# Patient Record
Sex: Male | Born: 1990 | Race: Black or African American | Hispanic: No | Marital: Single | Smoking: Current every day smoker
Health system: Southern US, Community
[De-identification: ages and names within clinical notes are randomized; demographics above are authoritative.]

## PROBLEM LIST (undated history)

## (undated) DIAGNOSIS — G43909 Migraine, unspecified, not intractable, without status migrainosus: Secondary | ICD-10-CM

## (undated) DIAGNOSIS — R569 Unspecified convulsions: Secondary | ICD-10-CM

## (undated) DIAGNOSIS — R011 Cardiac murmur, unspecified: Secondary | ICD-10-CM

## (undated) HISTORY — PX: OTHER SURGICAL HISTORY: SHX169

## (undated) HISTORY — DX: Migraine, unspecified, not intractable, without status migrainosus: G43.909

---
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
## 2009-08-07 ENCOUNTER — Emergency Department (HOSPITAL_COMMUNITY): Admission: EM | Admit: 2009-08-07 | Discharge: 2009-08-07 | Payer: Self-pay | Admitting: Emergency Medicine

## 2010-02-05 ENCOUNTER — Emergency Department (HOSPITAL_COMMUNITY)
Admission: EM | Admit: 2010-02-05 | Discharge: 2010-02-05 | Payer: Self-pay | Source: Home / Self Care | Admitting: Emergency Medicine

## 2010-02-27 ENCOUNTER — Emergency Department (HOSPITAL_COMMUNITY)
Admission: EM | Admit: 2010-02-27 | Discharge: 2010-02-28 | Payer: Self-pay | Source: Home / Self Care | Admitting: Emergency Medicine

## 2010-03-30 ENCOUNTER — Emergency Department (HOSPITAL_COMMUNITY)
Admission: EM | Admit: 2010-03-30 | Discharge: 2010-03-31 | Payer: Self-pay | Source: Home / Self Care | Admitting: Emergency Medicine

## 2010-05-12 LAB — URINALYSIS, ROUTINE W REFLEX MICROSCOPIC
Bilirubin Urine: NEGATIVE
Glucose, UA: NEGATIVE mg/dL
Hgb urine dipstick: NEGATIVE
Ketones, ur: NEGATIVE mg/dL
Nitrite: NEGATIVE
Protein, ur: NEGATIVE mg/dL
Specific Gravity, Urine: 1.004 — ABNORMAL LOW (ref 1.005–1.030)
Urobilinogen, UA: 0.2 mg/dL (ref 0.0–1.0)
pH: 6 (ref 5.0–8.0)

## 2010-05-12 LAB — CBC
HCT: 41.3 % (ref 39.0–52.0)
Hemoglobin: 13.8 g/dL (ref 13.0–17.0)
MCH: 27.6 pg (ref 26.0–34.0)
MCHC: 33.4 g/dL (ref 30.0–36.0)
MCV: 82.6 fL (ref 78.0–100.0)
Platelets: 136 10*3/uL — ABNORMAL LOW (ref 150–400)
RBC: 5 MIL/uL (ref 4.22–5.81)
RDW: 13.6 % (ref 11.5–15.5)
WBC: 9.1 10*3/uL (ref 4.0–10.5)

## 2010-05-12 LAB — COMPREHENSIVE METABOLIC PANEL
ALT: 11 U/L (ref 0–53)
AST: 17 U/L (ref 0–37)
Albumin: 4 g/dL (ref 3.5–5.2)
Alkaline Phosphatase: 47 U/L (ref 39–117)
BUN: 13 mg/dL (ref 6–23)
CO2: 25 mEq/L (ref 19–32)
Calcium: 9 mg/dL (ref 8.4–10.5)
Chloride: 108 mEq/L (ref 96–112)
Creatinine, Ser: 0.99 mg/dL (ref 0.4–1.5)
GFR calc Af Amer: >60 mL/min (ref >60)
GFR calc non Af Amer: >60 mL/min (ref >60)
Glucose, Bld: 99 mg/dL (ref 70–99)
Potassium: 2.8 mEq/L — ABNORMAL LOW (ref 3.5–5.1)
Sodium: 142 mEq/L (ref 135–145)
Total Bilirubin: 1.2 mg/dL (ref 0.3–1.2)
Total Protein: 6.8 g/dL (ref 6.0–8.3)

## 2010-05-12 LAB — DIFFERENTIAL
Basophils Absolute: 0 10*3/uL (ref 0.0–0.1)
Basophils Relative: 0 % (ref 0–1)
Eosinophils Absolute: 0.1 10*3/uL (ref 0.0–0.7)
Eosinophils Relative: 1 % (ref 0–5)
Lymphocytes Relative: 23 % (ref 12–46)
Lymphs Abs: 2.1 10*3/uL (ref 0.7–4.0)
Monocytes Absolute: 0.7 10*3/uL (ref 0.1–1.0)
Monocytes Relative: 8 % (ref 3–12)
Neutro Abs: 6.1 10*3/uL (ref 1.7–7.7)
Neutrophils Relative %: 68 % (ref 43–77)

## 2010-05-12 LAB — PHENYTOIN LEVEL, TOTAL: Phenytoin Lvl: <2.5 ug/mL — ABNORMAL LOW (ref 10.0–20.0)

## 2010-05-12 LAB — ETHANOL: Alcohol, Ethyl (B): 230 mg/dL — ABNORMAL HIGH (ref 0–10)

## 2010-05-19 LAB — URINALYSIS, ROUTINE W REFLEX MICROSCOPIC
Bilirubin Urine: NEGATIVE
Glucose, UA: NEGATIVE mg/dL
Hgb urine dipstick: NEGATIVE
Ketones, ur: NEGATIVE mg/dL
Nitrite: NEGATIVE
Protein, ur: NEGATIVE mg/dL
Specific Gravity, Urine: 1.024 (ref 1.005–1.030)
Urobilinogen, UA: 1 mg/dL (ref 0.0–1.0)
pH: 7 (ref 5.0–8.0)

## 2010-05-19 LAB — POCT I-STAT, CHEM 8
BUN: 17 mg/dL (ref 6–23)
Calcium, Ion: 1.17 mmol/L (ref 1.12–1.32)
Chloride: 108 mEq/L (ref 96–112)
Creatinine, Ser: 0.8 mg/dL (ref 0.4–1.5)
Glucose, Bld: 76 mg/dL (ref 70–99)
HCT: 44 % (ref 39.0–52.0)
Hemoglobin: 15 g/dL (ref 13.0–17.0)
Potassium: 3.8 mEq/L (ref 3.5–5.1)
Sodium: 141 mEq/L (ref 135–145)
TCO2: 25 mmol/L (ref 0–100)

## 2010-05-19 LAB — GLUCOSE, CAPILLARY: Glucose-Capillary: 85 mg/dL (ref 70–99)

## 2010-06-18 ENCOUNTER — Emergency Department (HOSPITAL_COMMUNITY): Payer: Medicaid Other

## 2010-06-18 ENCOUNTER — Emergency Department (HOSPITAL_COMMUNITY)
Admission: EM | Admit: 2010-06-18 | Discharge: 2010-06-18 | Disposition: A | Discharge status: Home / Self Care | Payer: Medicaid Other | Attending: Emergency Medicine | Admitting: Emergency Medicine

## 2010-06-18 DIAGNOSIS — W108XXA Fall (on) (from) other stairs and steps, initial encounter: Secondary | ICD-10-CM | POA: Insufficient documentation

## 2010-06-18 DIAGNOSIS — S8000XA Contusion of unspecified knee, initial encounter: Secondary | ICD-10-CM | POA: Insufficient documentation

## 2010-06-18 DIAGNOSIS — S60219A Contusion of unspecified wrist, initial encounter: Secondary | ICD-10-CM | POA: Insufficient documentation

## 2010-06-18 DIAGNOSIS — G40909 Epilepsy, unspecified, not intractable, without status epilepticus: Secondary | ICD-10-CM | POA: Insufficient documentation

## 2010-07-15 NOTE — Procedures (Signed)
EEG NUMBER:  [id number]   ADDENDUM:  A single burst about 60 minutes into the recording shows some  theta activity lasting for 4 to 5 seconds without any kind of premontory  changes in the electroencephalogram and no clinical activity was noted.  Most likely this reflects hypnagogic hypersynchrony associated with  drowsiness and does not look seizure-like in nature.  A repeat study in  1 or 2 weeks may help to reveal whether or not this is of clinical  concerns, but likely is a normal variant of sleep.      Catherine A. Orlin Hilding, M.D.  Electronically Signed     MWU:XLKG  D:  02/09/2007 16:11:01  T:  02/10/2007 09:01:36  Job #:  [id number]

## 2010-07-15 NOTE — Procedures (Signed)
This is an 20 year old with erratic behavior with outburst and suicide  risk with a history of syncopal episodes with some body jerking in the  past.  He is currently an inpatient at [facility].   MEDICATIONS:  Medications listed include Diamox, hydrochlorothiazide and  Vicon forte.   This is a portable 17 channel EEG with 1 channel devoted to EKG  utilizing [employer] 10/20 lead placement system.  The patient was  described as being awake and asleep clinically.  He also appeared to be  that in the condition electrographically.  Activation procedures were  not performed.  The background consists while awake of a mildly  disorganized but otherwise well modulated, well-developed 9-10 Hz alpha  activity shows predominant in the posterior head regions reactive to eye  opening.  No clear interhemispheric asymmetries identified and no  definite epileptiform discharges are seen.  There is a change in the  background with attenuation in the background with decreased frequency  in amps with drowsiness in the appearance of some sharp activity.  K  complex and sleep spindles are seen, consistent with sleep.  No definite  epileptiform discharges are seen.  Activation procedures were not  performed.  The EKG monitor reveals relatively regular rhythm with a  rate of 72 beats per minute.   CONCLUSION:  Probably normal awake and asleep EEG with a lot of central  activity and K complexes seen without clear epileptiform discharges  noted.  Clinical correlation is recommended.      Catherine A. Orlin Hilding, M.D.  Electronically Signed     WUJ:WJXB  D:  02/09/2007 15:55:07  T:  02/10/2007 08:40:55  Job #:  [id number]
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
## 2010-12-08 LAB — TSH: TSH: 1.915

## 2010-12-08 LAB — URINALYSIS, ROUTINE W REFLEX MICROSCOPIC
Bilirubin Urine: NEGATIVE
Glucose, UA: NEGATIVE
Hgb urine dipstick: NEGATIVE
Ketones, ur: NEGATIVE
Nitrite: NEGATIVE
Protein, ur: NEGATIVE
Specific Gravity, Urine: 1.026
Urobilinogen, UA: 0.2
pH: 6.5

## 2010-12-08 LAB — IRON AND TIBC
Iron: 69
Saturation Ratios: 22
TIBC: 307
UIBC: 238

## 2010-12-08 LAB — CORTISOL-AM, BLOOD: Cortisol - AM: 17.4

## 2010-12-08 LAB — FERRITIN: Ferritin: 71 (ref 22–322)

## 2010-12-08 LAB — PROLACTIN: Prolactin: 24 — ABNORMAL HIGH (ref 2.1–17.1)

## 2010-12-08 LAB — T4, FREE: Free T4: 0.97

## 2010-12-08 LAB — GC/CHLAMYDIA PROBE AMP, URINE
Chlamydia, Swab/Urine, PCR: NEGATIVE
GC Probe Amp, Urine: NEGATIVE

## 2010-12-08 LAB — SEDIMENTATION RATE: Sed Rate: 1

## 2010-12-08 LAB — MAGNESIUM: Magnesium: 2.4

## 2011-02-03 IMAGING — CT CT HEAD W/O CM
4 of 5 series · 18 of 47 positions shown, 19 images · non-contrast
Comparison: None.

CT HEAD

CLINICAL DATA: Seizure.

CT HEAD WITHOUT CONTRAST
CT CERVICAL SPINE WITHOUT CONTRAST
TECHNIQUE: Multidetector CT imaging of the head and cervical spine
was performed following the standard protocol without intravenous
contrast.  Multiplanar CT image reconstructions of the cervical
spine were also generated.

[Series 3: head_seq 4.5 h37s st · axial · 0.43mm/px · z∈[+1248,+1342]mm · 4 of 36 slices shown, 5 images]
[im 8/36  brain]
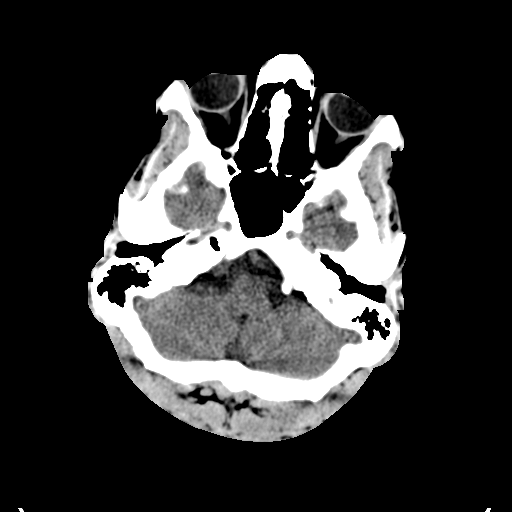
[im 8/36  bone]
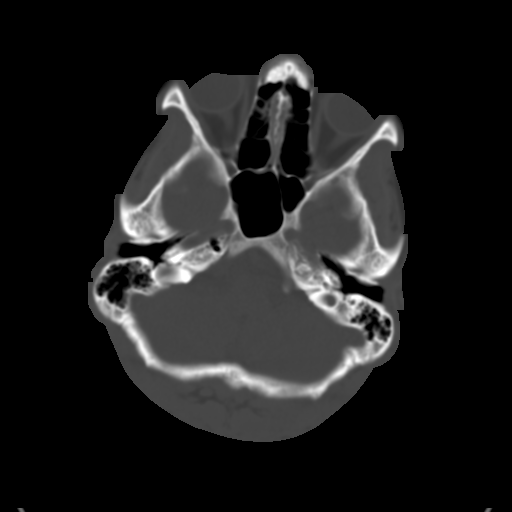
[im 15/36  brain]
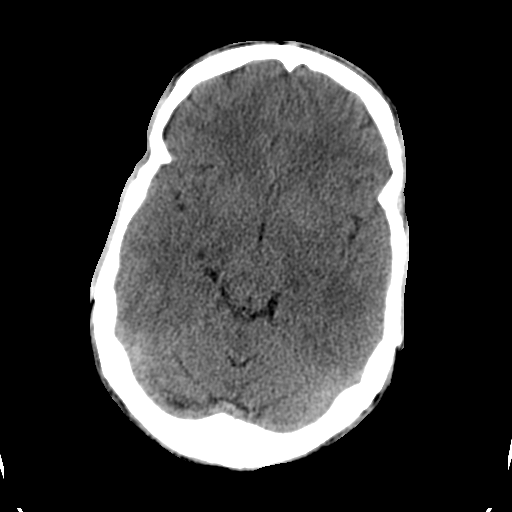
[im 22/36  brain]
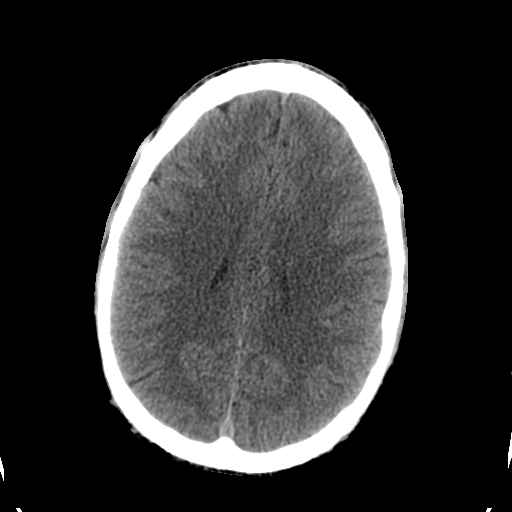
[im 29/36  brain]
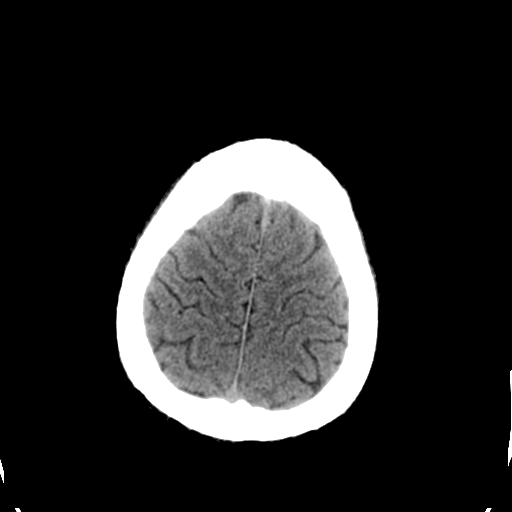

[Series 602: <mpr thick range> · coronal · 0.31mm/px · 3 of 33 slices shown]
[im 11/33  brain]
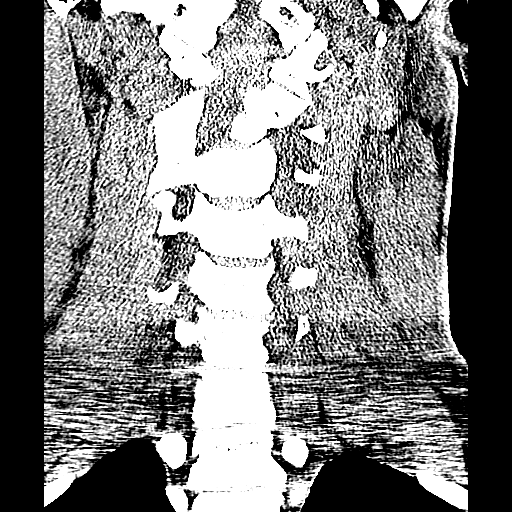
[im 15/33  brain]
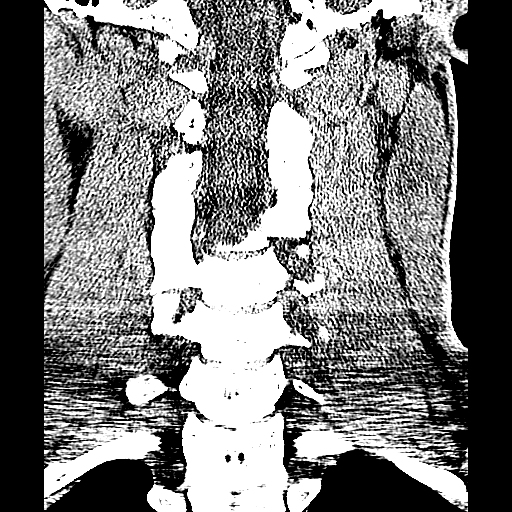
[im 18/33  brain]
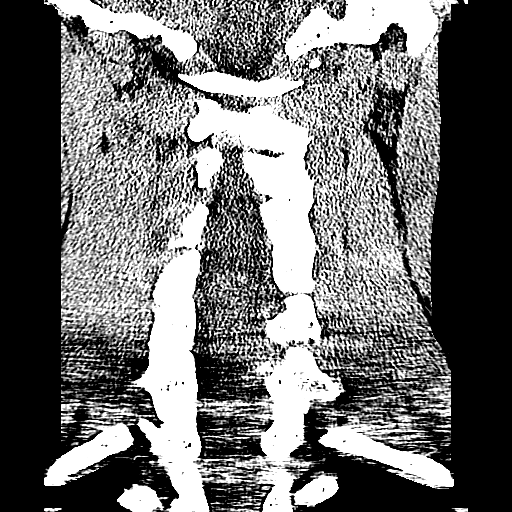

[Series 603: <mpr thick range(1)> · sagittal · 0.31mm/px · 3 of 38 slices shown]
[im 13/38  brain]
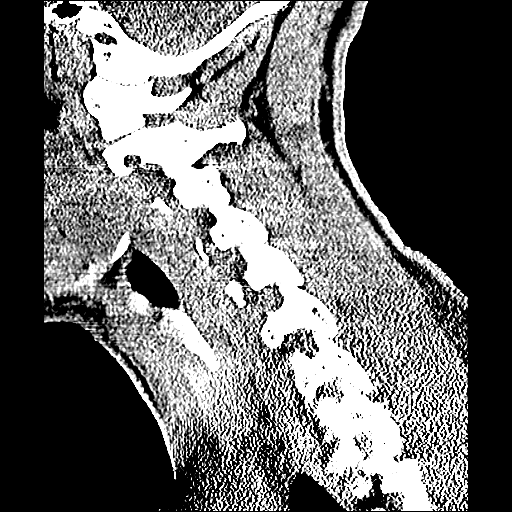
[im 19/38  brain]
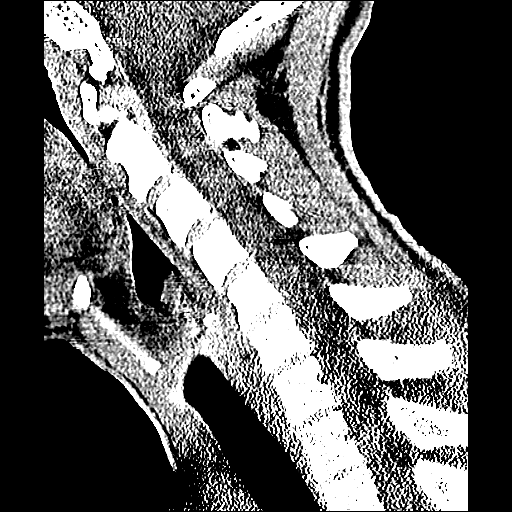
[im 25/38  brain]
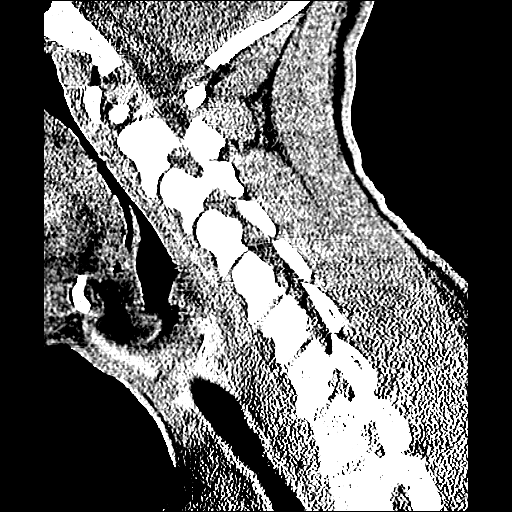

[Series 604: <mpr thick range(2)> · axial · 0.31mm/px · z∈[+1055,+1164]mm · 8 of 77 slices shown]
[im 7/77  brain]
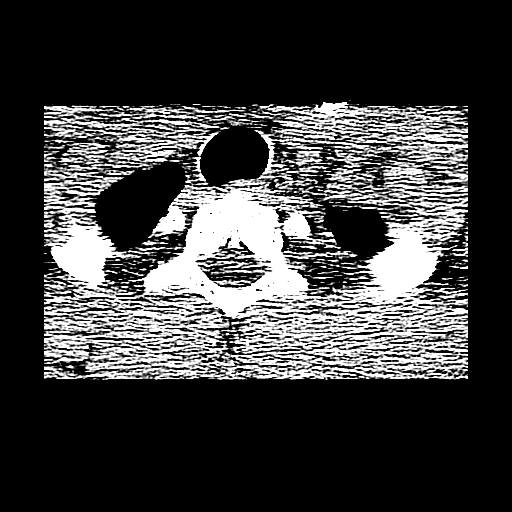
[im 14/77  brain]
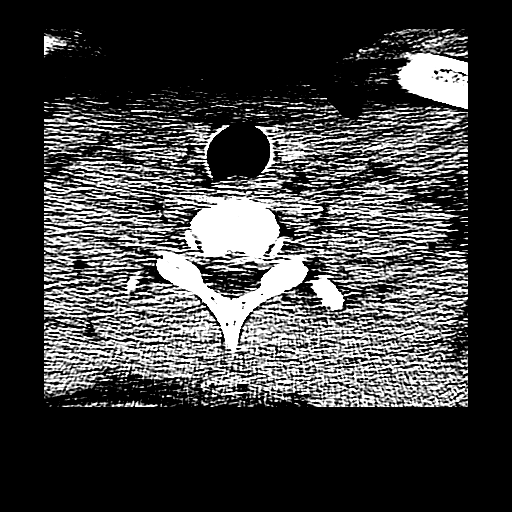
[im 28/77  brain]
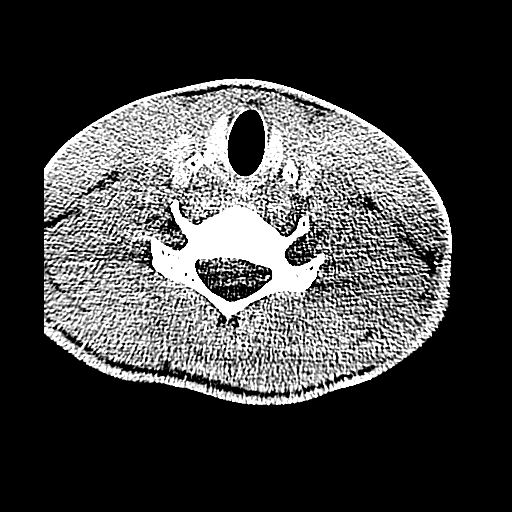
[im 35/77  brain]
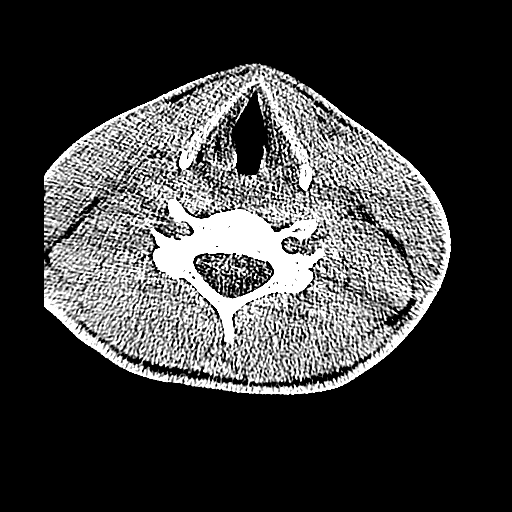
[im 42/77  brain]
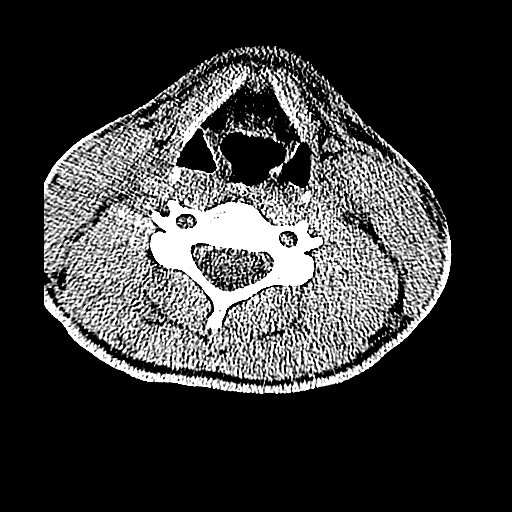
[im 49/77  brain]
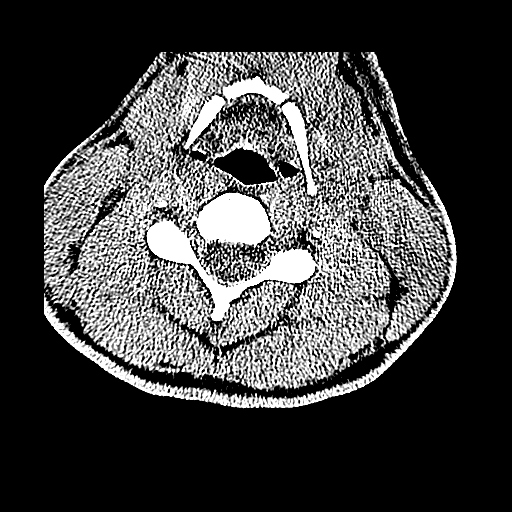
[im 63/77  brain]
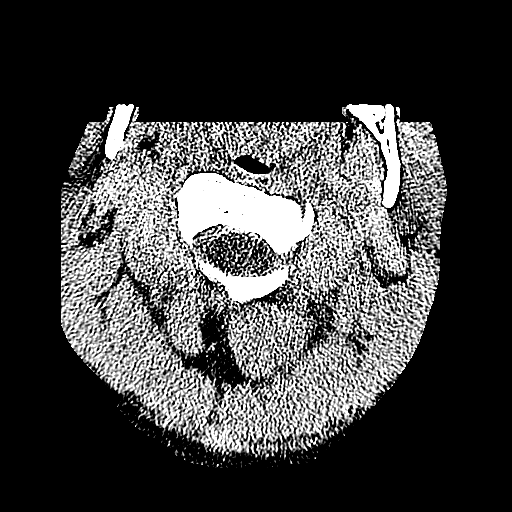
[im 70/77  brain]
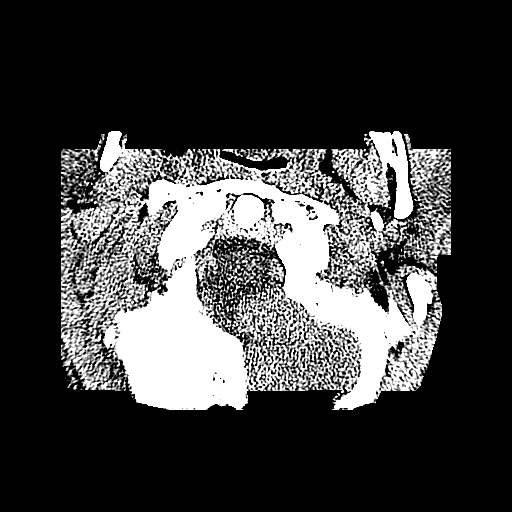

[18 of 47 positions shown; findings below may reference images not displayed]

FINDINGS: The brain appears normal without evidence of acute
infarction, hemorrhage, mass lesion, mass effect, midline shift or
abnormal extra-axial fluid collection.  No hydrocephalus.  Imaged
paranasal sinuses and mastoid air cells are clear.  The calvarium
is intact.  No pneumocephalus.
IMPRESSION: Negative head CT.

CT CERVICAL SPINE
FINDINGS: There is no fracture or subluxation of the cervical
spine.  No epidural hematoma.  Paraspinous structures unremarkable.
Is clear.
IMPRESSION: Negative exam.

## 2011-10-07 ENCOUNTER — Emergency Department (HOSPITAL_COMMUNITY)
Admission: EM | Admit: 2011-10-07 | Discharge: 2011-10-08 | Disposition: A | Discharge status: Home / Self Care | Payer: Medicaid Other | Attending: Emergency Medicine | Admitting: Emergency Medicine

## 2011-10-07 ENCOUNTER — Encounter (HOSPITAL_COMMUNITY): Payer: Self-pay | Admitting: Emergency Medicine

## 2011-10-07 DIAGNOSIS — R569 Unspecified convulsions: Secondary | ICD-10-CM | POA: Insufficient documentation

## 2011-10-07 HISTORY — DX: Unspecified convulsions: R56.9
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
## 2011-10-07 NOTE — ED Notes (Signed)
HQI:ON62<XB> Expected date:10/07/11<BR> Expected time:11:08 PM<BR> Means of arrival:Ambulance<BR> Comments:<BR> seizure

## 2011-10-08 LAB — CBC
Hemoglobin: 13.5 g/dL (ref 13.0–17.0)
MCH: 27.4 pg (ref 26.0–34.0)
MCV: 80.3 fL (ref 78.0–100.0)
Platelets: 130 10*3/uL — ABNORMAL LOW (ref 150–400)
RBC: 4.93 MIL/uL (ref 4.22–5.81)
WBC: 9 10*3/uL (ref 4.0–10.5)

## 2011-10-08 LAB — BASIC METABOLIC PANEL
CO2: 26 mEq/L (ref 19–32)
Chloride: 103 mEq/L (ref 96–112)
Creatinine, Ser: 0.88 mg/dL (ref 0.50–1.35)
Glucose, Bld: 89 mg/dL (ref 70–99)
Sodium: 139 mEq/L (ref 135–145)

## 2011-10-08 LAB — CK TOTAL AND CKMB (NOT AT ARMC)
CK, MB: 1.3 ng/mL (ref 0.3–4.0)
Total CK: 118 U/L (ref 7–232)

## 2011-10-08 LAB — PHENYTOIN LEVEL, FREE AND TOTAL

## 2011-10-08 LAB — TROPONIN I: Troponin I: <0.3 ng/mL (ref <0.30)

## 2011-10-08 MED ORDER — LEVETIRACETAM 500 MG PO TABS: 500.0000 mg | ORAL_TABLET | Freq: Two times a day (BID) | ORAL | Status: DC

## 2011-10-08 MED ORDER — IBUPROFEN 800 MG PO TABS
800.0000 mg | ORAL_TABLET | Freq: Once | ORAL | Status: AC
  Administered 2011-10-08: 800 mg via ORAL
  Filled 2011-10-08: qty 1

## 2011-10-08 MED ORDER — LEVETIRACETAM 500 MG PO TABS
1500.0000 mg | ORAL_TABLET | Freq: Once | ORAL | Status: AC
  Administered 2011-10-08: 1500 mg via ORAL
  Filled 2011-10-08: qty 3

## 2011-10-08 NOTE — ED Provider Notes (Addendum)
History     CSN: [id number]  Arrival date & time 10/07/11  2320   First MD Initiated Contact with Patient 10/08/11 0030      Chief Complaint  Patient presents with  . Seizures    (Consider location/radiation/quality/duration/timing/severity/associated sxs/prior treatment) HPI Comments: Patient is a 21 year old male with a history of tonic clonic seizures or presents emergency department with chief complaint of seizure.  Episode occurred this evening around 11 p.m. lasting approximately 10 minutes.  Patient states that he had an aura of abnormal smells and tastes prior to seizure.  Episode was witnessed and was described as eyes rolling back w full body tonic-clonic and loss of consciousness .  They deny bowel or bladder incontinence or tongue biting.  Patient presents post ictal feeling fatigued with a mild headache.  He reports he has not taken his Keppra for the last 6 months do to being out of her prescription.  Patient just moved here from [place] and did not have her neurologist or primary care physician in the area.  Patient reports increased stress decreased sleep but denies any recent illnesses, fever, night sweats, chills, shortness of breath, cough, hemoptysis, change in appetite, drug use or recent trauma.  Patient is a 21 y.o. male presenting with seizures. The history is provided by the patient.  Seizures  Associated symptoms include headaches.    Past Medical History  Diagnosis Date  . Seizures     History reviewed. No pertinent past surgical history.  No family history on file.  History  Substance Use Topics  . Smoking status: Not on file  . Smokeless tobacco: Not on file  . Alcohol Use:       Review of Systems  Constitutional: Positive for fatigue.  Neurological: Positive for seizures and headaches.  All other systems reviewed and are negative.    Allergies  Review of patient's allergies indicates no known allergies.  Home Medications   Current  Outpatient Rx  Name Route Sig Dispense Refill  . LEVETIRACETAM 500 MG PO TABS Oral Take 500 mg by mouth 2 (two) times daily.      BP 128/70  Pulse 57  Temp 98.4 F (36.9 C) (Oral)  Resp 19  SpO2 100%  Physical Exam  Nursing note and vitals reviewed. Constitutional: He appears well-developed and well-nourished. No distress.       Post ictal, mildly altered, Hypertensive   HENT:  Head: Normocephalic.       Atraumatic, No evidence of tongue oral lacerations  Eyes: EOM are normal. Pupils are equal, round, and reactive to light.  Neck: Normal range of motion. Neck supple.       Cervical spinous process non tender without step offs, no difficulty or pain with flexion or extension of neck  Cardiovascular: Normal rate, regular rhythm, normal heart sounds and intact distal pulses.   Pulmonary/Chest: Breath sounds normal. No respiratory distress. He has no wheezes. He has no rales.  Abdominal: Soft. There is no tenderness.  Musculoskeletal: He exhibits no edema and no tenderness.       Full active & passive ROM of arms bilaterally  Neurological:       CN III-VII intact. Iintact coordination, sensation, and motor (finger grip, biceps, hamstrings & dorsiflexion). No pass pointing, good rapid coordination. Gait normal.   Skin: Skin is warm and dry. He is not diaphoretic.       intact    ED Course  Procedures (including critical care time)  Labs Reviewed  CBC -  Abnormal; Notable for the following:    Platelets 130 (*)     All other components within normal limits  GLUCOSE, CAPILLARY  BASIC METABOLIC PANEL  CK TOTAL AND CKMB  TROPONIN I  PHENYTOIN LEVEL, FREE   No results found.   No diagnosis found.   Date: 12/10/2011  Rate: 59  Rhythm: normal sinus rhythm  QRS Axis: normal  Intervals: normal  ST/T Wave abnormalities: normal  Conduction Disutrbances: none  Narrative Interpretation:   Old EKG Reviewed: No significant changes noted     MDM  Seizure  Patient given  loading dose of Keppra and discharge with short course of his previous dose.  Advised to followup with neurology.  Return depressions discussed.  Patient with normal vital signs throughout stay.  Labs reviewed.  Due to patient's presentation imaging is not necessary at this time.  No focal neuro deficits on exam.        Jaci Carrel, PA-C 10/08/11 0138  Jaci Carrel, PA-C 12/10/11 9147

## 2011-10-08 NOTE — ED Notes (Addendum)
EKG given to EDP, Delo,MD. 

## 2011-10-09 NOTE — ED Provider Notes (Signed)
Medical screening examination/treatment/procedure(s) were performed by non-physician practitioner and as supervising physician I was immediately available for consultation/collaboration.  Sapir Lavey, MD 10/09/11 0852 

## 2011-12-06 ENCOUNTER — Emergency Department (HOSPITAL_COMMUNITY)
Admission: EM | Admit: 2011-12-06 | Discharge: 2011-12-07 | Discharge status: Against Medical Advice | Payer: Medicaid Other | Attending: Emergency Medicine | Admitting: Emergency Medicine

## 2011-12-06 ENCOUNTER — Encounter (HOSPITAL_COMMUNITY): Payer: Self-pay | Admitting: *Deleted

## 2011-12-06 ENCOUNTER — Emergency Department (HOSPITAL_COMMUNITY): Payer: Medicaid Other

## 2011-12-06 DIAGNOSIS — R05 Cough: Secondary | ICD-10-CM | POA: Insufficient documentation

## 2011-12-06 DIAGNOSIS — R059 Cough, unspecified: Secondary | ICD-10-CM | POA: Insufficient documentation

## 2011-12-06 DIAGNOSIS — R509 Fever, unspecified: Secondary | ICD-10-CM | POA: Insufficient documentation

## 2011-12-06 DIAGNOSIS — R070 Pain in throat: Secondary | ICD-10-CM | POA: Insufficient documentation

## 2011-12-06 HISTORY — DX: Cardiac murmur, unspecified: R01.1

## 2011-12-06 NOTE — ED Notes (Signed)
Pt states has had a cough for two wks; sore throat; states coughed up a small amt dark blood this morning; fever

## 2011-12-07 NOTE — ED Notes (Signed)
Pt not in room when PA went to examine him.

## 2011-12-10 NOTE — ED Provider Notes (Signed)
Medical screening examination/treatment/procedure(s) were performed by non-physician practitioner and as supervising physician I was immediately available for consultation/collaboration.  Geoffery Lyons, MD 12/10/11 [phone]

## 2012-07-06 ENCOUNTER — Encounter: Payer: Self-pay | Admitting: Nurse Practitioner

## 2012-07-06 ENCOUNTER — Ambulatory Visit (INDEPENDENT_AMBULATORY_CARE_PROVIDER_SITE_OTHER): Payer: Medicaid Other | Admitting: Nurse Practitioner

## 2012-07-06 VITALS — BP 114/71 | HR 60 | Ht 66.0 in | Wt 134.0 lb

## 2012-07-06 DIAGNOSIS — R569 Unspecified convulsions: Secondary | ICD-10-CM | POA: Insufficient documentation

## 2012-07-06 DIAGNOSIS — R011 Cardiac murmur, unspecified: Secondary | ICD-10-CM | POA: Insufficient documentation

## 2012-07-06 DIAGNOSIS — G43909 Migraine, unspecified, not intractable, without status migrainosus: Secondary | ICD-10-CM | POA: Insufficient documentation

## 2012-07-06 MED ORDER — LEVETIRACETAM 500 MG PO TABS: 500.0000 mg | ORAL_TABLET | Freq: Two times a day (BID) | ORAL | Status: AC

## 2012-07-06 NOTE — Patient Instructions (Addendum)
Continue Keppra at the current dose, renewed at your pharmacy [facility]  mental health is located at [address]. [place] phone [phone] To obtain primary care provider call [phone] and ask for physician referral, they will ask questions about insurance and give you names of providers that are taking patients.  If you are looking for work you can go through Vocational rehab. [phone]

## 2012-07-06 NOTE — Progress Notes (Signed)
HPI: Returns for followup after last visit 08/22/2010. History of seizure disorder. His seizures are preceded by a prodrome of lightheadedness and dizziness. This is then followed by loss of consciousness whole-body twitching tongue biting and incontinence. He has had seizures in the past with increased alcohol consumption. He denies that today. He is currently driving. Patient has checked suicidal thoughts on this intake sheet however today he denies that he has had a plan and he was made aware that he needs to followup with mental health for counseling and medication management. He reports that his last seizure was over 6 months ago. He recently moved back to this area from [place].He denies  recent  staring spells, confusion, sleep disturbances, lapses of time, and bowel and bladder incontinence. Patient does not have primary care provider. Last EEG performed in 2012 is normal. Patient is a poor historian.   ROS:  Seizure disorder, depression decreased energy this interest in activities racing thoughts and suicidal thoughts  Physical Exam General: well developed, well nourished, seated, in no evident distress Head: head normocephalic and atraumatic. Oropharynx benign Neck: supple with no carotid or supraclavicular bruits Cardiovascular: regular rate and rhythm, no murmurs  Neurologic Exam Mental Status: Awake and fully alert. Oriented to place and time. Poor historian.   Cranial Nerves:  Pupils equal, briskly reactive to light. Extraocular movements full without nystagmus. Visual fields full to confrontation. Hearing intact and symmetric to finger snap. Facial sensation intact. Face, tongue, palate move normally and symmetrically. Neck flexion and extension normal.  Motor: Normal bulk and tone. Normal strength in all tested extremity muscles. Sensory.: intact to touch and pinprick and vibratory.  Coordination: Rapid alternating movements normal in all extremities. Finger-to-nose and heel-to-shin  performed accurately bilaterally. Gait and Station: Arises from chair without difficulty. Stance is normal. Gait demonstrates normal stride length and balance . Able to heel, toe and tandem walk without difficulty.  Reflexes: 1+ and symmetric. Toes downgoing.     ASSESSMENT: Seizure disorder since 2008. Normal neurologic exam. EEG normal in 2012 initial evaluation at [facility]. 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 Last seizure over 6 months ago.Untreated depression, anxiety.       PLAN: Continue Keppra at the current dose, renewed at your pharmacy Call [facility]  mental health at [address]. [place] phone [phone], for treatment of depression, anxiety and suicide thoughts. He was also asked to go to the ER if he has suicide thoughts which he denies today.  To obtain primary care provider call [phone] and ask for physician referral, they will ask questions about insurance and give you names of providers that are taking patients.  If you are looking for work you can go through Vocational rehab. [phone]  Nilda Riggs, GNP-BC APRN

## 2012-07-06 NOTE — Progress Notes (Signed)
I reviewed note and agree with plan.   Suanne Marker, MD 07/06/2012, 11:55 AM Certified in Neurology, Neurophysiology and Neuroimaging  [facility] [address] [place], [place] [address] [phone]

## 2012-07-12 NOTE — Progress Notes (Signed)
I reviewed note and agree with plan.   Suanne Marker, MD 07/12/2012, 5:54 PM Certified in Neurology, Neurophysiology and Neuroimaging  [facility] [address] [place], [place] [address] [phone]

## 2012-09-19 ENCOUNTER — Encounter (HOSPITAL_COMMUNITY): Payer: Self-pay | Admitting: *Deleted

## 2012-09-19 ENCOUNTER — Emergency Department (HOSPITAL_COMMUNITY)
Admission: EM | Admit: 2012-09-19 | Discharge: 2012-09-20 | Disposition: A | Discharge status: Home / Self Care | Payer: Medicaid Other | Attending: Emergency Medicine | Admitting: Emergency Medicine

## 2012-09-19 DIAGNOSIS — R011 Cardiac murmur, unspecified: Secondary | ICD-10-CM | POA: Insufficient documentation

## 2012-09-19 DIAGNOSIS — G40909 Epilepsy, unspecified, not intractable, without status epilepticus: Secondary | ICD-10-CM | POA: Insufficient documentation

## 2012-09-19 DIAGNOSIS — Z9114 Patient's other noncompliance with medication regimen: Secondary | ICD-10-CM

## 2012-09-19 DIAGNOSIS — Z8679 Personal history of other diseases of the circulatory system: Secondary | ICD-10-CM | POA: Insufficient documentation

## 2012-09-19 DIAGNOSIS — F17200 Nicotine dependence, unspecified, uncomplicated: Secondary | ICD-10-CM | POA: Insufficient documentation

## 2012-09-19 DIAGNOSIS — R569 Unspecified convulsions: Secondary | ICD-10-CM

## 2012-09-19 NOTE — ED Notes (Signed)
NWG:NF62<ZH> Expected date:09/19/12<BR> Expected time:11:26 PM<BR> Means of arrival:Ambulance<BR> Comments:<BR> seizures

## 2012-09-19 NOTE — ED Notes (Addendum)
Pt from home. Family heard a "thud" and found pt down on floor on right side. Family does not know how long he was down. EMS called. Pt does not recall having seizure activity. Pt denies injuries from seisure activity. Pt fully alert and oriented x 4, Neuro intact at this time. Pt had seizure activity a week ago. Reports taking Keppra as prescribed.

## 2012-09-20 LAB — POCT I-STAT, CHEM 8
Glucose, Bld: 92 mg/dL (ref 70–99)
HCT: 41 % (ref 39.0–52.0)
Hemoglobin: 13.9 g/dL (ref 13.0–17.0)
Potassium: 4.3 mEq/L (ref 3.5–5.1)
Sodium: 143 mEq/L (ref 135–145)

## 2012-09-20 LAB — CBC WITH DIFFERENTIAL/PLATELET
Eosinophils Absolute: 0.1 10*3/uL (ref 0.0–0.7)
Eosinophils Relative: 2 % (ref 0–5)
HCT: 38.4 % — ABNORMAL LOW (ref 39.0–52.0)
Hemoglobin: 13 g/dL (ref 13.0–17.0)
Lymphocytes Relative: 28 % (ref 12–46)
Lymphs Abs: 1.9 10*3/uL (ref 0.7–4.0)
MCH: 27.5 pg (ref 26.0–34.0)
MCV: 81.4 fL (ref 78.0–100.0)
Monocytes Absolute: 0.7 10*3/uL (ref 0.1–1.0)
Monocytes Relative: 11 % (ref 3–12)
RBC: 4.72 MIL/uL (ref 4.22–5.81)
WBC: 6.7 10*3/uL (ref 4.0–10.5)

## 2012-09-20 LAB — RAPID URINE DRUG SCREEN, HOSP PERFORMED
Amphetamines: NOT DETECTED
Barbiturates: NOT DETECTED
Opiates: NOT DETECTED
Tetrahydrocannabinol: NOT DETECTED

## 2012-09-20 MED ORDER — LORAZEPAM 2 MG/ML IJ SOLN
1.0000 mg | Freq: Once | INTRAMUSCULAR | Status: AC
  Administered 2012-09-20: 1 mg via INTRAVENOUS
  Filled 2012-09-20: qty 1

## 2012-09-20 NOTE — ED Provider Notes (Signed)
History    CSN: [id number] Arrival date & time 09/19/12  2347  First MD Initiated Contact with Patient 09/20/12 0024     Chief Complaint  Patient presents with  . Seizures   (Consider location/radiation/quality/duration/timing/severity/associated sxs/prior Treatment) HPI Comments: Patient has not used his medications for the past 2-3 weeks because it leaves a bitter taste in his mouth for a minute after ingestion  Took 1 dose yesterday than was in the process of taking a dose tonight when he had a seizure. His father who was in the other room heard him fall and was immediately to his side there was no abnormal movements but patient was unresponsive for about 15 minutes When EMS arrive he ws back at his baseline.  Has no complains of pain in any specific location, did not become incontinent   Patient is a 22 y.o. male presenting with seizures. The history is provided by the patient.  Seizures Seizure activity on arrival: no   Seizure type:  Unable to specify Preceding symptoms comment:  Medication non compliace Initial focality:  Unable to specify Episode characteristics: unresponsiveness   Episode characteristics: no abnormal movements, no apnea, no combativeness, no confusion, no disorientation, no eye deviation, no focal shaking, no generalized shaking and no incontinence   Postictal symptoms: confusion   Return to baseline: yes   Severity:  Moderate Timing:  Once Context: medical non-compliance   Recent head injury:  No recent head injuries PTA treatment:  None History of seizures: yes    Past Medical History  Diagnosis Date  . Seizures   . Seizures   . Heart murmur   . Migraines    Past Surgical History  Procedure Laterality Date  . None     Family History  Problem Relation Age of Onset  . Diabetes Mother   . COPD Mother   . Diabetes Father    History  Substance Use Topics  . Smoking status: Current Every Day Smoker -- 2.00 packs/day    Types: Cigarettes  .  Smokeless tobacco: Never Used  . Alcohol Use: No    Review of Systems  Constitutional: Negative for fever and chills.  Respiratory: Negative for cough.   Genitourinary: Negative for dysuria.  Musculoskeletal: Negative for myalgias.  Skin: Negative for rash and wound.  Neurological: Positive for seizures. Negative for dizziness, speech difficulty, weakness and headaches.  All other systems reviewed and are negative.    Allergies  Review of patient's allergies indicates no known allergies.  Home Medications   Current Outpatient Rx  Name  Route  Sig  Dispense  Refill  . levETIRAcetam (KEPPRA) 500 MG tablet   Oral   Take 1 tablet (500 mg total) by mouth 2 (two) times daily.   60 tablet   6    BP 122/86  Pulse 74  Temp(Src) 98.2 F (36.8 C) (Oral)  SpO2 98% Physical Exam  Nursing note and vitals reviewed. Constitutional: He appears well-developed and well-nourished.  HENT:  Head: Normocephalic and atraumatic.  Eyes: Pupils are equal, round, and reactive to light.  Neck: Normal range of motion.  Cardiovascular: Normal rate and regular rhythm.   Pulmonary/Chest: Effort normal.  Abdominal: Soft.  Musculoskeletal: Normal range of motion. He exhibits no edema and no tenderness.  Neurological: He is alert.  Skin: Skin is warm and dry.    ED Course  Procedures (including critical care time) Labs Reviewed  URINE RAPID DRUG SCREEN (HOSP PERFORMED)  CBC WITH DIFFERENTIAL  POCT I-STAT, CHEM  8   No results found. 1. Seizures   2. Non compliance w medication regimen     MDM  No further seizure activity labs reviewed Pateint encouraged to take medication daily   Arman Filter, NP 09/20/12 0206

## 2012-09-20 NOTE — ED Notes (Signed)
Patient transported to X-ray 

## 2012-09-20 NOTE — ED Provider Notes (Signed)
Medical screening examination/treatment/procedure(s) were performed by non-physician practitioner and as supervising physician I was immediately available for consultation/collaboration.  John-Adam Evander Macaraeg, M.D.     John-Adam Jahzara Slattery, MD 09/20/12 0738 

## 2012-11-15 ENCOUNTER — Emergency Department (HOSPITAL_COMMUNITY)
Admission: EM | Admit: 2012-11-15 | Discharge: 2012-11-15 | Disposition: A | Discharge status: Home / Self Care | Payer: Medicaid Other | Attending: Emergency Medicine | Admitting: Emergency Medicine

## 2012-11-15 ENCOUNTER — Encounter (HOSPITAL_COMMUNITY): Payer: Self-pay | Admitting: Emergency Medicine

## 2012-11-15 DIAGNOSIS — R011 Cardiac murmur, unspecified: Secondary | ICD-10-CM | POA: Insufficient documentation

## 2012-11-15 DIAGNOSIS — Z9114 Patient's other noncompliance with medication regimen: Secondary | ICD-10-CM

## 2012-11-15 DIAGNOSIS — Z8679 Personal history of other diseases of the circulatory system: Secondary | ICD-10-CM | POA: Insufficient documentation

## 2012-11-15 DIAGNOSIS — Z79899 Other long term (current) drug therapy: Secondary | ICD-10-CM | POA: Insufficient documentation

## 2012-11-15 DIAGNOSIS — Z9119 Patient's noncompliance with other medical treatment and regimen: Secondary | ICD-10-CM | POA: Insufficient documentation

## 2012-11-15 DIAGNOSIS — R569 Unspecified convulsions: Secondary | ICD-10-CM

## 2012-11-15 DIAGNOSIS — IMO0001 Reserved for inherently not codable concepts without codable children: Secondary | ICD-10-CM | POA: Insufficient documentation

## 2012-11-15 DIAGNOSIS — F29 Unspecified psychosis not due to a substance or known physiological condition: Secondary | ICD-10-CM | POA: Insufficient documentation

## 2012-11-15 DIAGNOSIS — F17200 Nicotine dependence, unspecified, uncomplicated: Secondary | ICD-10-CM | POA: Insufficient documentation

## 2012-11-15 DIAGNOSIS — Z91199 Patient's noncompliance with other medical treatment and regimen due to unspecified reason: Secondary | ICD-10-CM | POA: Insufficient documentation

## 2012-11-15 DIAGNOSIS — G40909 Epilepsy, unspecified, not intractable, without status epilepticus: Secondary | ICD-10-CM | POA: Insufficient documentation

## 2012-11-15 LAB — CBC
HCT: 38.7 % — ABNORMAL LOW (ref 39.0–52.0)
MCH: 27.8 pg (ref 26.0–34.0)
MCV: 80.3 fL (ref 78.0–100.0)
Platelets: 123 10*3/uL — ABNORMAL LOW (ref 150–400)
RBC: 4.82 MIL/uL (ref 4.22–5.81)

## 2012-11-15 LAB — BASIC METABOLIC PANEL
CO2: 25 mEq/L (ref 19–32)
Calcium: 9.3 mg/dL (ref 8.4–10.5)
Chloride: 105 mEq/L (ref 96–112)
Glucose, Bld: 108 mg/dL — ABNORMAL HIGH (ref 70–99)
Sodium: 140 mEq/L (ref 135–145)

## 2012-11-15 LAB — URINALYSIS, ROUTINE W REFLEX MICROSCOPIC
Bilirubin Urine: NEGATIVE
Glucose, UA: NEGATIVE mg/dL
Hgb urine dipstick: NEGATIVE
Specific Gravity, Urine: 1.02 (ref 1.005–1.030)
Urobilinogen, UA: 1 mg/dL (ref 0.0–1.0)
pH: 6.5 (ref 5.0–8.0)

## 2012-11-15 MED ORDER — PHENYTOIN SODIUM 50 MG/ML IJ SOLN
1000.0000 mg | Freq: Once | INTRAMUSCULAR | Status: DC
  Filled 2012-11-15: qty 20

## 2012-11-15 NOTE — ED Notes (Signed)
Bed: RESB Expected date:  Expected time:  Means of arrival:  Comments: EMS: Seizure 

## 2012-11-15 NOTE — ED Notes (Signed)
Per EMS, pt had witnessed grand mal seizure, hx of the same. Pt was on the floor during seizure, did not fall, no injuries noted. Pt reports he does not take his Keppra because he does not like the way it taste.  Pt postictal at this time.

## 2012-11-21 NOTE — ED Provider Notes (Signed)
CSN: [id number]     Arrival date & time 11/15/12  0135 History   First MD Initiated Contact with Patient 11/15/12 0451     Chief Complaint  Patient presents with  . Seizures   (Consider location/radiation/quality/duration/timing/severity/associated sxs/prior Treatment) Patient is a 22 y.o. male presenting with seizures. The history is provided by the patient.  Seizures  patient was seizure. History of same. Has some confusion. He has not been taking his Keppra due to the taste. Patient states he hurts all over. No fall. Seizure was witnessed. He denies drug use. States his been eating and drinking well. No fever. I  Past Medical History  Diagnosis Date  . Seizures   . Seizures   . Heart murmur   . Migraines    Past Surgical History  Procedure Laterality Date  . None     Family History  Problem Relation Age of Onset  . Diabetes Mother   . COPD Mother   . Diabetes Father    History  Substance Use Topics  . Smoking status: Current Every Day Smoker -- 2.00 packs/day    Types: Cigarettes  . Smokeless tobacco: Never Used  . Alcohol Use: No    Review of Systems  Constitutional: Negative for activity change and appetite change.  HENT: Negative for neck stiffness.   Eyes: Negative for pain.  Respiratory: Negative for chest tightness and shortness of breath.   Cardiovascular: Negative for chest pain and leg swelling.  Gastrointestinal: Negative for nausea, vomiting, abdominal pain and diarrhea.  Genitourinary: Negative for flank pain.  Musculoskeletal: Positive for myalgias. Negative for back pain.  Skin: Negative for rash.  Neurological: Positive for seizures. Negative for weakness, numbness and headaches.  Psychiatric/Behavioral: Negative for behavioral problems.    Allergies  Review of patient's allergies indicates no known allergies.  Home Medications   Current Outpatient Rx  Name  Route  Sig  Dispense  Refill  . levETIRAcetam (KEPPRA) 500 MG tablet   Oral   Take  1 tablet (500 mg total) by mouth 2 (two) times daily.   60 tablet   6    BP 106/60  Pulse 79  Temp(Src) 97.8 F (36.6 C) (Oral)  Resp 18  SpO2 100% Physical Exam  Nursing note and vitals reviewed. Constitutional: He is oriented to person, place, and time. He appears well-developed and well-nourished.  HENT:  Head: Normocephalic and atraumatic.  Eyes: EOM are normal. Pupils are equal, round, and reactive to light.  Neck: Normal range of motion. Neck supple.  Cardiovascular: Normal rate, regular rhythm and normal heart sounds.   No murmur heard. Pulmonary/Chest: Effort normal and breath sounds normal.  Abdominal: Soft. Bowel sounds are normal. He exhibits no distension and no mass. There is no tenderness. There is no rebound and no guarding.  Musculoskeletal: Normal range of motion. He exhibits no edema.  Neurological: He is alert and oriented to person, place, and time. No cranial nerve deficit.  Skin: Skin is warm and dry.  Psychiatric: He has a normal mood and affect.    ED Course  Procedures (including critical care time) Labs Review Labs Reviewed  BASIC METABOLIC PANEL - Abnormal; Notable for the following:    Glucose, Bld 108 (*)    All other components within normal limits  CBC - Abnormal; Notable for the following:    HCT 38.7 (*)    Platelets 123 (*)    All other components within normal limits  GLUCOSE, CAPILLARY - Abnormal; Notable for the following:  Glucose-Capillary 103 (*)    All other components within normal limits  URINALYSIS, ROUTINE W REFLEX MICROSCOPIC   Imaging Review No results found.  MDM   1. Seizures   2. Noncompliance with medications    Patient presents after seizure. Has been on Keppra but does not take it because she does not like the way it tastes. Patient has had previous noncompliance to the taste. Was going to switch to Dilantin for possible but her compliance. Patient was decided to leave and states that he would take a At this  time.    Juliet Rude. Rubin Payor, MD 11/21/12 [phone]

## 2013-01-06 ENCOUNTER — Ambulatory Visit: Payer: Medicaid Other | Admitting: Nurse Practitioner

## 2014-02-23 ENCOUNTER — Emergency Department (HOSPITAL_COMMUNITY): Payer: Medicaid Other

## 2014-02-23 ENCOUNTER — Encounter (HOSPITAL_COMMUNITY): Payer: Self-pay | Admitting: Emergency Medicine

## 2014-02-23 ENCOUNTER — Emergency Department (HOSPITAL_COMMUNITY)
Admission: EM | Admit: 2014-02-23 | Discharge: 2014-02-23 | Disposition: A | Discharge status: Home / Self Care | Payer: Medicaid Other | Attending: Emergency Medicine | Admitting: Emergency Medicine

## 2014-02-23 DIAGNOSIS — W500XXA Accidental hit or strike by another person, initial encounter: Secondary | ICD-10-CM | POA: Diagnosis not present

## 2014-02-23 DIAGNOSIS — S62336A Displaced fracture of neck of fifth metacarpal bone, right hand, initial encounter for closed fracture: Secondary | ICD-10-CM | POA: Diagnosis not present

## 2014-02-23 DIAGNOSIS — Z720 Tobacco use: Secondary | ICD-10-CM | POA: Insufficient documentation

## 2014-02-23 DIAGNOSIS — Y9389 Activity, other specified: Secondary | ICD-10-CM | POA: Diagnosis not present

## 2014-02-23 DIAGNOSIS — R011 Cardiac murmur, unspecified: Secondary | ICD-10-CM | POA: Insufficient documentation

## 2014-02-23 DIAGNOSIS — Z79899 Other long term (current) drug therapy: Secondary | ICD-10-CM | POA: Insufficient documentation

## 2014-02-23 DIAGNOSIS — Y998 Other external cause status: Secondary | ICD-10-CM | POA: Diagnosis not present

## 2014-02-23 DIAGNOSIS — Y9289 Other specified places as the place of occurrence of the external cause: Secondary | ICD-10-CM | POA: Insufficient documentation

## 2014-02-23 DIAGNOSIS — S6291XA Unspecified fracture of right wrist and hand, initial encounter for closed fracture: Secondary | ICD-10-CM

## 2014-02-23 DIAGNOSIS — S62339A Displaced fracture of neck of unspecified metacarpal bone, initial encounter for closed fracture: Secondary | ICD-10-CM

## 2014-02-23 DIAGNOSIS — G40909 Epilepsy, unspecified, not intractable, without status epilepticus: Secondary | ICD-10-CM | POA: Insufficient documentation

## 2014-02-23 DIAGNOSIS — S6991XA Unspecified injury of right wrist, hand and finger(s), initial encounter: Secondary | ICD-10-CM | POA: Diagnosis present

## 2014-02-23 MED ORDER — IBUPROFEN 600 MG PO TABS: 600.0000 mg | ORAL_TABLET | Freq: Four times a day (QID) | ORAL | Status: AC | PRN

## 2014-02-23 MED ORDER — TRAMADOL HCL 50 MG PO TABS: 50.0000 mg | ORAL_TABLET | Freq: Four times a day (QID) | ORAL | Status: AC | PRN

## 2014-02-23 NOTE — Discharge Instructions (Signed)
Boxer's Fracture °Boxer's fracture is a broken bone (fracture) of the fourth or fifth metacarpal (ring or pinky finger). The metacarpal bones connect the wrist to the fingers and make up the arch of the hand. Boxer's fracture occurs toward the body (proximal) from the first knuckle. This injury is known as a boxer's fracture, because it often occurs from hitting an object with a closed fist. °SYMPTOMS  °· Severe pain at the time of injury. °· Pain and swelling around the first knuckle on the fourth or fifth finger. °· Bruising (contusion) in the area within 48 hours of injury. °· Visible deformity, such as a pushed down knuckle. This can occur if the fracture is complete, and the bone fragments separate enough to distort normal body shape. °· Numbness or paralysis from swelling in the hand, causing pressure on the blood vessels or nerves (uncommon). °CAUSES  °· Direct injury (trauma), such as a striking blow with the fist. °· Indirect stress to the hand, such as twisting or violent muscle contraction (uncommon). °RISK INCREASES WITH: °· Punching an object with an unprotected knuckle. °· Contact sports (football, rugby). °· Sports that require hitting (boxing, martial arts). °· History of bone or joint disease (osteoporosis). °PREVENTION °· Maintain physical fitness: °¨ Muscle strength and flexibility. °¨ Endurance. °¨ Cardiovascular fitness. °· For participation in contact sports, wear proper protective equipment for the hand and make sure it fits properly. °· Learn and use proper technique when hitting or punching. °PROGNOSIS  °When proper treatment is given, to ensure normal alignment of the bones, healing can usually be expected in 4 to 6 weeks. Occasionally, surgery is necessary.  °RELATED COMPLICATIONS  °· Bone does not heal back together (nonunion). °· Bone heals together in an improper position (malunion), causing twisting of the finger when making a fist. °· Chronic pain, stiffness, or swelling of the  hand. °· Excessive bleeding in the hand, causing pressure and injury to nerves and blood vessels (rare). °· Stopping of normal hand growth in children. °· Infection of the wound, if skin is broken over the fracture (open fracture), or at the incision site if surgery is performed. °· Shortening of injured bones. °· Bony bump (prominence) in the palm or loss of shape of the knuckles. °· Pain and weakness when gripping. °· Arthritis of the affected joint, if the fracture goes into the joint, after repeated injury, or after delayed treatment. °· Scarring around the knuckle, and limited motion. °TREATMENT  °Treatment varies, depending on the injury. The place in the hand where the injury occurs has a great deal of motion, which allows the hand to move properly. If the fracture is not aligned properly, this function may be decreased. If the bone ends are in proper alignment, treatment first involves ice and elevation of the injured hand, at or above heart level, to reduce inflammation. Pain medicines help to relieve pain. Treatment also involves restraint by splinting, bandaging, casting, or bracing for 4 or more weeks.  °If the fracture is out of alignment (displaced), or it involves the joint, surgery is usually recommended. Surgery typically involves cutting through the skin to place removable pins, screws, and sometimes plates over the fracture. After surgery, the bone and joint are restrained for 4 or more weeks. After restraint (with or without surgery), stretching and strengthening exercises are needed to regain proper strength and function of the joint. Exercises may be done at home or with the assistance of a therapist. Depending on the sport and position played, a   brace or splint may be recommended when first returning to sports.  MEDICATION   If pain medication is necessary, nonsteroidal anti-inflammatory medications, such as aspirin and ibuprofen, or other minor pain relievers, such as acetaminophen, are  often recommended.  Do not take pain medication for 7 days before surgery.  Prescription pain relievers may be necessary. Use only as directed and only as much as you need. COLD THERAPY Cold treatment (icing) relieves pain and reduces inflammation. Cold treatment should be applied for 10 to 15 minutes every 2 to 3 hours for inflammation and pain, and immediately after any activity that aggravates your symptoms. Use ice packs or an ice massage. SEEK MEDICAL CARE IF:   Pain, tenderness, or swelling gets worse, despite treatment.  You experience pain, numbness, or coldness in the hand.  Blue, gray, or dark color appears in the fingernails.  You develop signs of infection, after surgery (fever, increased pain, swelling, redness, drainage of fluids, or bleeding in the surgical area).  You feel you have reinjured the hand.  New, unexplained symptoms develop. (Drugs used in treatment may produce side effects.) Document Released: 02/16/2005 Document Revised: 05/11/2011 Document Reviewed: 05/31/2008 [facility] Patient Information 2015 [employer]. This information is not intended to replace advice given to you by your health care provider. Make sure you discuss any questions you have with your health care provider.  Cast or Splint Care Casts and splints support injured limbs and keep bones from moving while they heal. It is important to care for your cast or splint at home.  HOME CARE INSTRUCTIONS  Keep the cast or splint uncovered during the drying period. It can take 24 to 48 hours to dry if it is made of plaster. A fiberglass cast will dry in less than 1 hour.  Do not rest the cast on anything harder than a pillow for the first 24 hours.  Do not put weight on your injured limb or apply pressure to the cast until your health care provider gives you permission.  Keep the cast or splint dry. Wet casts or splints can lose their shape and may not support the limb as well. A wet cast that has  lost its shape can also create harmful pressure on your skin when it dries. Also, wet skin can become infected.  Cover the cast or splint with a plastic bag when bathing or when out in the rain or snow. If the cast is on the trunk of the body, take sponge baths until the cast is removed.  If your cast does become wet, dry it with a towel or a blow dryer on the cool setting only.  Keep your cast or splint clean. Soiled casts may be wiped with a moistened cloth.  Do not place any hard or soft foreign objects under your cast or splint, such as cotton, toilet paper, lotion, or powder.  Do not try to scratch the skin under the cast with any object. The object could get stuck inside the cast. Also, scratching could lead to an infection. If itching is a problem, use a blow dryer on a cool setting to relieve discomfort.  Do not trim or cut your cast or remove padding from inside of it.  Exercise all joints next to the injury that are not immobilized by the cast or splint. For example, if you have a long leg cast, exercise the hip joint and toes. If you have an arm cast or splint, exercise the shoulder, elbow, thumb, and fingers.  Elevate your injured arm or leg on 1 or 2 pillows for the first 1 to 3 days to decrease swelling and pain.It is best if you can comfortably elevate your cast so it is higher than your heart. SEEK MEDICAL CARE IF:   Your cast or splint cracks.  Your cast or splint is too tight or too loose.  You have unbearable itching inside the cast.  Your cast becomes wet or develops a soft spot or area.  You have a bad smell coming from inside your cast.  You get an object stuck under your cast.  Your skin around the cast becomes red or raw.  You have new pain or worsening pain after the cast has been applied. SEEK IMMEDIATE MEDICAL CARE IF:   You have fluid leaking through the cast.  You are unable to move your fingers or toes.  You have discolored (blue or white), cool,  painful, or very swollen fingers or toes beyond the cast.  You have tingling or numbness around the injured area.  You have severe pain or pressure under the cast.  You have any difficulty with your breathing or have shortness of breath.  You have chest pain. Document Released: 02/14/2000 Document Revised: 12/07/2012 Document Reviewed: 08/25/2012 [facility] Patient Information 2015 [employer], [place]. This information is not intended to replace advice given to you by your health care provider. Make sure you discuss any questions you have with your health care provider.
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
## 2014-11-21 ENCOUNTER — Telehealth: Payer: Self-pay | Admitting: *Deleted
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
## 2014-11-27 NOTE — Telephone Encounter (Signed)
I sent to MR to scan into the system.  Pt had no returned call.
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
## 2018-08-11 ENCOUNTER — Other Ambulatory Visit: Payer: Self-pay | Admitting: *Deleted

## 2018-08-11 DIAGNOSIS — Z20822 Contact with and (suspected) exposure to covid-19: Secondary | ICD-10-CM

## 2018-08-14 LAB — NOVEL CORONAVIRUS, NAA: SARS-CoV-2, NAA: NOT DETECTED

## 2018-09-15 ENCOUNTER — Other Ambulatory Visit: Payer: Self-pay | Admitting: *Deleted

## 2018-09-15 DIAGNOSIS — Z20822 Contact with and (suspected) exposure to covid-19: Secondary | ICD-10-CM

## 2018-09-15 NOTE — Progress Notes (Signed)
..  lb

## 2018-09-20 LAB — NOVEL CORONAVIRUS, NAA: SARS-CoV-2, NAA: NOT DETECTED

## 2019-03-27 DIAGNOSIS — Z0271 Encounter for disability determination: Secondary | ICD-10-CM

## 2019-10-15 ENCOUNTER — Emergency Department (HOSPITAL_COMMUNITY)
Admission: EM | Admit: 2019-10-15 | Discharge: 2019-10-15 | Disposition: A | Discharge status: Home / Self Care | Payer: Medicaid Other | Attending: Emergency Medicine | Admitting: Emergency Medicine

## 2019-10-15 ENCOUNTER — Encounter (HOSPITAL_COMMUNITY): Payer: Self-pay | Admitting: Obstetrics and Gynecology

## 2019-10-15 ENCOUNTER — Other Ambulatory Visit: Payer: Self-pay

## 2019-10-15 DIAGNOSIS — S01312A Laceration without foreign body of left ear, initial encounter: Secondary | ICD-10-CM | POA: Insufficient documentation

## 2019-10-15 DIAGNOSIS — Y9289 Other specified places as the place of occurrence of the external cause: Secondary | ICD-10-CM | POA: Diagnosis not present

## 2019-10-15 DIAGNOSIS — Y9389 Activity, other specified: Secondary | ICD-10-CM | POA: Insufficient documentation

## 2019-10-15 DIAGNOSIS — Y998 Other external cause status: Secondary | ICD-10-CM | POA: Insufficient documentation

## 2019-10-15 DIAGNOSIS — Z23 Encounter for immunization: Secondary | ICD-10-CM | POA: Insufficient documentation

## 2019-10-15 DIAGNOSIS — W19XXXA Unspecified fall, initial encounter: Secondary | ICD-10-CM | POA: Insufficient documentation

## 2019-10-15 DIAGNOSIS — F17210 Nicotine dependence, cigarettes, uncomplicated: Secondary | ICD-10-CM | POA: Insufficient documentation

## 2019-10-15 MED ORDER — CEPHALEXIN 500 MG PO CAPS: 500.0000 mg | ORAL_CAPSULE | Freq: Four times a day (QID) | ORAL | 0 refills | Status: AC

## 2019-10-15 MED ORDER — BACITRACIN ZINC 500 UNIT/GM EX OINT
TOPICAL_OINTMENT | Freq: Two times a day (BID) | CUTANEOUS | Status: DC
  Administered 2019-10-15: 1 via TOPICAL
  Filled 2019-10-15: qty 0.9

## 2019-10-15 MED ORDER — LIDOCAINE HCL 2 % IJ SOLN
20.0000 mL | Freq: Once | INTRAMUSCULAR | Status: AC
  Administered 2019-10-15: 400 mg
  Filled 2019-10-15: qty 20

## 2019-10-15 MED ORDER — BACITRACIN-NEOMYCIN-POLYMYXIN 400-5-5000 EX OINT: 1.0000 "application " | TOPICAL_OINTMENT | Freq: Two times a day (BID) | CUTANEOUS | 0 refills | Status: AC

## 2019-10-15 MED ORDER — LIDOCAINE-EPINEPHRINE (PF) 2 %-1:200000 IJ SOLN: 20.0000 mL | Freq: Once | INTRAMUSCULAR | Status: DC

## 2019-10-15 MED ORDER — TETANUS-DIPHTH-ACELL PERTUSSIS 5-2.5-18.5 LF-MCG/0.5 IM SUSP
0.5000 mL | Freq: Once | INTRAMUSCULAR | Status: AC
  Administered 2019-10-15: 0.5 mL via INTRAMUSCULAR
  Filled 2019-10-15: qty 0.5

## 2019-10-15 NOTE — Discharge Instructions (Signed)
We saw you in the ER for laceration to your ear.  Please take the medicine prescribed. See the ENT doctor in 5-7 days for suture removal.  Return to the ER if you start having any pain in the ear, pus drainage, increased swelling.

## 2019-10-15 NOTE — ED Provider Notes (Signed)
[facility]-EMERGENCY DEPT Provider Note   CSN: [id number] Arrival date & time: 10/15/19  1736     History Chief Complaint  Patient presents with  . Fall  . Laceration    Gary Bond is a 29 y.o. male.  HPI    29 year old comes in a chief complaint of fall. Patient had a mechanical fall prior to ED arrival.  Patient fell onto left side and ended up splitting his ear.  Patient had active bleeding, that ultimately stopped with pressure.  No headaches. Pt has no associated nausea, vomiting, seizures, loss of consciousness or new visual complains, weakness, numbness, dizziness or gait instability.  No chest pain, shortness of breath.  Past Medical History:  Diagnosis Date  . Heart murmur   . Migraines   . Seizures (HCC)   . Seizures [facility])     Patient Active Problem List   Diagnosis Date Noted  . Seizures (HCC)   . Heart murmur   . Migraines     Past Surgical History:  Procedure Laterality Date  . None         Family History  Problem Relation Age of Onset  . Diabetes Mother   . COPD Mother   . Diabetes Father     Social History   Tobacco Use  . Smoking status: Current Every Day Smoker    Packs/day: 2.00    Types: Cigarettes  . Smokeless tobacco: Never Used  Vaping Use  . Vaping Use: Never used  Substance Use Topics  . Alcohol use: Yes    Comment: 2 cases  . Drug use: No    Home Medications Prior to Admission medications   Medication Sig Start Date End Date Taking? Authorizing Provider  cephALEXin (KEFLEX) 500 MG capsule Take 1 capsule (500 mg total) by mouth 4 (four) times daily. 10/15/19   Derwood Kaplan, MD  ibuprofen (ADVIL,MOTRIN) 600 MG tablet Take 1 tablet (600 mg total) by mouth every 6 (six) hours as needed. 02/23/14   Lurene Shadow, PA-C  levETIRAcetam (KEPPRA) 500 MG tablet Take 1 tablet (500 mg total) by mouth 2 (two) times daily. 07/06/12 07/06/13  Nilda Riggs, NP  neomycin-bacitracin-polymyxin  (NEOSPORIN) ointment Apply 1 application topically 2 (two) times daily. 10/15/19   Derwood Kaplan, MD  traMADol (ULTRAM) 50 MG tablet Take 1 tablet (50 mg total) by mouth every 6 (six) hours as needed. 02/23/14   Lurene Shadow, PA-C    Allergies    Patient has no known allergies.  Review of Systems   Review of Systems  Constitutional: Positive for activity change.  HENT: Positive for ear pain.   Neurological: Negative for headaches.  Hematological: Does not bruise/bleed easily.    Physical Exam Updated Vital Signs BP 125/80 (BP Location: Right Arm)   Pulse 77   Temp 98.7 F (37.1 C) (Oral)   Resp 18   Ht 5\' 7"  (1.702 m)   Wt 72.6 kg   SpO2 98%   BMI 25.06 kg/m   Physical Exam Vitals and nursing note reviewed.  Constitutional:      Appearance: He is well-developed.  HENT:     Head: Atraumatic.     Comments: 6 cm laceration to the ear (helix / antehelix), split ear, no cartilage exposure Cardiovascular:     Rate and Rhythm: Normal rate.  Pulmonary:     Effort: Pulmonary effort is normal.  Musculoskeletal:     Cervical back: Neck supple.  Skin:    General:  Skin is warm.  Neurological:     General: No focal deficit present.     Mental Status: He is alert and oriented to person, place, and time.     Cranial Nerves: No cranial nerve deficit.     Sensory: No sensory deficit.     ED Results / Procedures / Treatments   Labs (all labs ordered are listed, but only abnormal results are displayed) Labs Reviewed - No data to display  EKG None  Radiology No results found.  Procedures .Nerve Block  Date/Time: 10/15/2019 11:00 PM Performed by: Derwood Kaplan, MD Authorized by: Derwood Kaplan, MD   Consent:    Consent obtained:  Verbal   Consent given by:  Patient Universal protocol:    Procedure explained and questions answered to patient or proxy's satisfaction: yes   Indications:    Indications:  Procedural anesthesia Location:    Body area:  Head    Head nerve:  Auricular Pre-procedure details:    Skin preparation:  Alcohol Procedure details (see MAR for exact dosages):    Block needle gauge:  25 G   Anesthetic injected:  Lidocaine 2% w/o epi   Injection procedure:  Anatomic landmarks identified Post-procedure details:    Dressing:  None   Outcome:  Anesthesia achieved   Patient tolerance of procedure:  Tolerated well, no immediate complications .Marland KitchenLaceration Repair  Date/Time: 10/15/2019 11:01 PM Performed by: Derwood Kaplan, MD Authorized by: Derwood Kaplan, MD   Consent:    Consent obtained:  Verbal   Consent given by:  Patient   Risks discussed:  Infection and pain   Alternatives discussed:  No treatment Universal protocol:    Procedure explained and questions answered to patient or proxy's satisfaction: yes     Patient identity confirmed:  Arm band Laceration details:    Location:  Ear   Ear location:  L ear   Length (cm):  6 Repair type:    Repair type:  Complex Pre-procedure details:    Preparation:  Patient was prepped and draped in usual sterile fashion Exploration:    Limited defect created (wound extended): yes     Contaminated: yes   Treatment:    Area cleansed with:  Saline   Amount of cleaning:  Extensive   Irrigation solution:  Sterile saline   Irrigation volume:  100   Debridement:  None   Undermining:  None Skin repair:    Repair method:  Sutures   Suture size:  5-0   Wound skin closure material used: ETHILON.   Suture technique:  Simple interrupted   Number of sutures:  7 Approximation:    Approximation:  Close Post-procedure details:    Dressing:  Antibiotic ointment   Patient tolerance of procedure:  Tolerated well, no immediate complications   (including critical care time)  Medications Ordered in ED Medications  bacitracin ointment (1 application Topical Given 10/15/19 2213)  lidocaine (XYLOCAINE) 2 % (with pres) injection 400 mg (400 mg Infiltration Given 10/15/19 2046)  Tdap  (BOOSTRIX) injection 0.5 mL (0.5 mLs Intramuscular Given 10/15/19 2212)    ED Course  I have reviewed the triage vital signs and the nursing notes.  Pertinent labs & imaging results that were available during my care of the patient were reviewed by me and considered in my medical decision making (see chart for details).    MDM Rules/Calculators/A&P  29 year old comes in a chief complaint of ear laceration.  He would like because of blunt trauma.  No red flags suggesting elevated ICP.  No battle sign. I do not think CT head needed.  Laceration was repaired satisfactorily.  I did discuss the case with Dr. Jearld Fenton, who reported that given that there was no cartilage exposure, he was comfortable with ED fixing the laceration and he recommends follow-up in 5 to 7 days with ENT with Keflex.  Final Clinical Impression(s) / ED Diagnoses Final diagnoses:  Complex laceration of left ear, initial encounter    Rx / DC Orders ED Discharge Orders         Ordered    cephALEXin (KEFLEX) 500 MG capsule  4 times daily     Discontinue  Reprint     10/15/19 2202    neomycin-bacitracin-polymyxin (NEOSPORIN) ointment  2 times daily     Discontinue  Reprint     10/15/19 2204           Derwood Kaplan, MD 10/15/19 2308

## 2019-10-15 NOTE — ED Triage Notes (Signed)
Patient reports to the ER following a fall and laceration to the left ear. Patient reports he tripped.
